# Patient Record
Sex: Female | Born: 2000 | Race: Black or African American | Hispanic: No | Marital: Single | Smoking: Never smoker
Health system: Southern US, Community
[De-identification: ages and names within clinical notes are randomized; demographics above are authoritative.]

## PROBLEM LIST (undated history)

## (undated) DIAGNOSIS — R55 Syncope and collapse: Secondary | ICD-10-CM

## (undated) HISTORY — PX: TONSILLECTOMY: SUR1361

## (undated) HISTORY — DX: Syncope and collapse: R55

---
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
## 2022-07-22 ENCOUNTER — Emergency Department (HOSPITAL_COMMUNITY)
Admission: EM | Admit: 2022-07-22 | Discharge: 2022-07-22 | Discharge status: Against Medical Advice | Payer: Medicaid Other | Attending: Student | Admitting: Student

## 2022-07-22 ENCOUNTER — Other Ambulatory Visit (HOSPITAL_COMMUNITY): Payer: Medicaid Other

## 2022-07-22 ENCOUNTER — Emergency Department (HOSPITAL_COMMUNITY): Payer: Medicaid Other

## 2022-07-22 ENCOUNTER — Encounter (HOSPITAL_COMMUNITY): Payer: Self-pay

## 2022-07-22 ENCOUNTER — Other Ambulatory Visit: Payer: Self-pay

## 2022-07-22 DIAGNOSIS — Y990 Civilian activity done for income or pay: Secondary | ICD-10-CM | POA: Insufficient documentation

## 2022-07-22 DIAGNOSIS — S060XAA Concussion with loss of consciousness status unknown, initial encounter: Secondary | ICD-10-CM | POA: Insufficient documentation

## 2022-07-22 DIAGNOSIS — W208XXA Other cause of strike by thrown, projected or falling object, initial encounter: Secondary | ICD-10-CM | POA: Diagnosis not present

## 2022-07-22 DIAGNOSIS — Z5321 Procedure and treatment not carried out due to patient leaving prior to being seen by health care provider: Secondary | ICD-10-CM | POA: Diagnosis not present

## 2022-07-22 DIAGNOSIS — R200 Anesthesia of skin: Secondary | ICD-10-CM | POA: Insufficient documentation

## 2022-07-22 NOTE — ED Provider Triage Note (Signed)
Emergency Medicine Provider Triage Evaluation Note  Phoebe Marter , a 21 y.o. female  was evaluated in triage.  Pt complains of right-sided facial numbness intermittently today.  Was seen at [facility], had a head injury and was told she was having concussion no imaging.  Patient is still having symptoms of concern.  No blurry vision, lateralized weakness or numbness, malocclusion..  Review of Systems  Per HPI  Physical Exam  BP (!) 131/91 (BP Location: Right Arm)   Pulse 82   Temp 98.1 F (36.7 C) (Oral)   Resp 18   Ht 5\' 5"  (1.651 m)   Wt 81.6 kg   LMP 07/04/2022 (Exact Date)   SpO2 100%   BMI 29.95 kg/m  Gen:   Awake, no distress   Resp:  Normal effort  MSK:   Moves extremities without difficulty  Other:    Medical Decision Making  Medically screening exam initiated at 8:59 PM.  Appropriate orders placed.  Stevee Valenta was informed that the remainder of the evaluation will be completed by another provider, this initial triage assessment does not replace that evaluation, and the importance of remaining in the ED until their evaluation is complete.     Cecilio Asper, PA-C 07/22/22 2100

## 2022-07-22 NOTE — ED Triage Notes (Signed)
Pt reports she was diagnosed with a concussion on Monday after a metal pole fell on her head at work. She reports the right side of her face feels numb and she also has a headache, onset about 1830. She is A&OX4, ambulatory with independent steady gait. No facial droop.
